# Patient Record
Sex: Female | Born: 1985 | Race: Black or African American | Hispanic: No | Marital: Single | Smoking: Current every day smoker
Health system: Southern US, Community
[De-identification: ages and names within clinical notes are randomized; demographics above are authoritative.]

---

## 2007-12-02 ENCOUNTER — Emergency Department (HOSPITAL_COMMUNITY): Admission: EM | Admit: 2007-12-02 | Discharge: 2007-12-02 | Payer: Self-pay | Admitting: Family Medicine

## 2007-12-19 ENCOUNTER — Emergency Department (HOSPITAL_COMMUNITY): Admission: EM | Admit: 2007-12-19 | Discharge: 2007-12-19 | Payer: Self-pay | Admitting: Emergency Medicine

## 2009-03-16 ENCOUNTER — Emergency Department (HOSPITAL_COMMUNITY): Admission: EM | Admit: 2009-03-16 | Discharge: 2009-03-16 | Payer: Self-pay | Admitting: Family Medicine

## 2011-11-04 ENCOUNTER — Ambulatory Visit (INDEPENDENT_AMBULATORY_CARE_PROVIDER_SITE_OTHER): Payer: BC Managed Care – PPO | Admitting: Family Medicine

## 2011-11-04 VITALS — BP 124/76 | HR 93 | Temp 98.8°F | Resp 16 | Ht 66.5 in | Wt 198.4 lb

## 2011-11-04 DIAGNOSIS — L509 Urticaria, unspecified: Secondary | ICD-10-CM

## 2011-11-04 MED ORDER — PREDNISONE 20 MG PO TABS: ORAL_TABLET | ORAL | Status: AC

## 2011-11-04 NOTE — Progress Notes (Signed)
[facility] [address], [place] [address] [phone]  Date:  11/04/2011   Name:  Leah Daniels   DOB:  1985-07-30   MRN:  [id number]  PCP:  No primary provider on file.    Chief Complaint: Rash   History of Present Illness:  Leah Daniels is a 26 y.o. very pleasant female patient who presents with the following:  She has noted a rash on her arms, trunk, and sometimes on her thighs for about a month.  Allegra does seem to help. She has some itching, but not severely pruritic. The rash seems to start with hives, and then forms flat macules.   She is generally healthy.  She has not noted any signs or symptoms of illness.   She has not tried any creams or topicals.  She just takes her allegra in the morning and this does seem to help.   The only new exposure she can think of is that she had artificial braids placed, but this was done about 10 weeks ago.   LMP last week.   There is no problem list on file for this patient.   No past medical history on file.  No past surgical history on file.  History  Substance Use Topics  . Smoking status: Not on file  . Smokeless tobacco: Not on file  . Alcohol Use: Not on file    No family history on file.  No Known Allergies  Medication list has been reviewed and updated.  Current Outpatient Prescriptions on File Prior to Visit  Medication Sig Dispense Refill  . fexofenadine (ALLEGRA) 180 MG tablet Take 180 mg by mouth daily.        Review of Systems:  As per HPI- otherwise negative.   Physical Examination: Filed Vitals:   11/04/11 1259  BP: 124/76  Pulse: 93  Temp: 98.8 F (37.1 C)  Resp: 16   Filed Vitals:   11/04/11 1259  Height: 5' 6.5" (1.689 m)  Weight: 198 lb 6.4 oz (89.994 kg)   Body mass index is 31.54 kg/(m^2). Ideal Body Weight: Weight in (lb) to have BMI = 25: 156.9   GEN: WDWN, NAD, Non-toxic, A & O x 3, overweight HEENT: Atraumatic, Normocephalic. Neck supple. No masses, No  LAD.  TM and oropharynx wnl, PEERL, EOMI Ears and Nose: No external deformity. CV: RRR, No M/G/R. No JVD. No thrill. No extra heart sounds. PULM: CTA B, no wheezes, crackles, rhonchi. No retractions. No resp. distress. No accessory muscle use. EXTR: No c/c/e NEURO Normal gait.  PSYCH: Normally interactive. Conversant. Not depressed or anxious appearing.  Calm demeanor.  Skin: she has a blanching rash, a few urticaria and macules scattered over her arms (hands and wrists spared), upper back and chest/ breasts.  No other rash noted   Assessment and Plan: 1. Urticaria  predniSONE (DELTASONE) 20 MG tablet   Urticarial type rash- will try a short course of prednisone.  Let us know if not better in the next few days- Sooner if worse.   May continue Phyllis Ginger, MD

## 2012-10-31 ENCOUNTER — Ambulatory Visit (INDEPENDENT_AMBULATORY_CARE_PROVIDER_SITE_OTHER): Payer: BC Managed Care – PPO | Admitting: Physician Assistant

## 2012-10-31 VITALS — BP 124/76 | HR 100 | Temp 98.0°F | Resp 17 | Ht 67.0 in | Wt 198.0 lb

## 2012-10-31 DIAGNOSIS — N760 Acute vaginitis: Secondary | ICD-10-CM

## 2012-10-31 DIAGNOSIS — N898 Other specified noninflammatory disorders of vagina: Secondary | ICD-10-CM

## 2012-10-31 LAB — POCT WET PREP WITH KOH
KOH Prep POC: NEGATIVE
Trichomonas, UA: NEGATIVE
Yeast Wet Prep HPF POC: NEGATIVE

## 2012-10-31 MED ORDER — METRONIDAZOLE 500 MG PO TABS: 500.0000 mg | ORAL_TABLET | Freq: Two times a day (BID) | ORAL | Status: DC

## 2012-10-31 NOTE — Progress Notes (Signed)
  Subjective:    Patient ID: Leah Daniels, female    DOB: [birth year], 27 y.o.   MRN: [id number]  HPI   Leah Daniels is a very pleasant 27 yr old female here with concern for vaginal discharge.  States this has been going for a few weeks, maybe a month.  Describes a milky white discharge.  Has never had anything like this before.  Denies irritation or itching.  Denies urinary symptoms.  Not currently sexually active.  Last sexual encounter was 3 months ago with a female partner.  No concern for STI but would like testing today.  Thinks last testing was a number of years ago.  LMP 10/16/12, regular every month.     Review of Systems  Constitutional: Negative.   HENT: Negative.   Respiratory: Negative.   Cardiovascular: Negative.   Gastrointestinal: Negative.   Genitourinary: Positive for vaginal discharge. Negative for dysuria, frequency, hematuria, flank pain, vaginal bleeding and menstrual problem.  Musculoskeletal: Negative.   Skin: Negative.   Neurological: Negative.        Objective:   Physical Exam  Vitals reviewed. Constitutional: She is oriented to person, place, and time. She appears well-developed and well-nourished. No distress.  HENT:  Head: Normocephalic and atraumatic.  Eyes: Conjunctivae are normal. No scleral icterus.  Cardiovascular: Normal rate, regular rhythm and normal heart sounds.   Pulmonary/Chest: Effort normal and breath sounds normal.  Abdominal: Soft. Bowel sounds are normal. There is no tenderness.  Genitourinary: Uterus normal. There is no rash, tenderness or lesion on the right labia. There is no rash, tenderness or lesion on the left labia. Cervix exhibits no motion tenderness, no discharge and no friability. Right adnexum displays no mass, no tenderness and no fullness. Left adnexum displays no mass, no tenderness and no fullness. Vaginal discharge (homogenous, thin, white discharge) found.  Neurological: She is alert and oriented to person, place, and time.   Skin: Skin is warm and dry.  Psychiatric: She has a normal mood and affect. Her behavior is normal.    Results for orders placed in visit on 10/31/12  POCT WET PREP WITH KOH      Result Value Range   Trichomonas, UA Negative     Clue Cells Wet Prep HPF POC 2-3     Epithelial Wet Prep HPF POC 3-8     Yeast Wet Prep HPF POC neg     Bacteria Wet Prep HPF POC 4+     RBC Wet Prep HPF POC 1     WBC Wet Prep HPF POC 0-2     KOH Prep POC Negative           Assessment & Plan:  Bacterial vaginosis - Plan: metroNIDAZOLE (FLAGYL) 500 MG tablet  Vaginal discharge - Plan: POCT Wet Prep with KOH, GC/Chlamydia Probe Amp, CANCELED: POCT UA - Microscopic Only, CANCELED: POCT urinalysis dipstick   Leah Daniels is a pleasant 27 yr old female with bacterial vaginosis.  Will treat with Flagyl PO x 7 days.  Discussed RTC precautions.  Will follow up on genprobe and treat if necessary

## 2012-10-31 NOTE — Patient Instructions (Addendum)
Begin taking the metronidazole as directed.  DO NOT DRINK ALCOHOL WHILE TAKING THIS MEDICATION or for 48 hours after your last dose.  Please let me know if any symptoms are worsening or not improving.  I will let you know when the rest of your labs are back   Bacterial Vaginosis Bacterial vaginosis (BV) is a vaginal infection where the normal balance of bacteria in the vagina is disrupted. The normal balance is then replaced by an overgrowth of certain bacteria. There are several different kinds of bacteria that can cause BV. BV is the most common vaginal infection in women of childbearing age. CAUSES   The cause of BV is not fully understood. BV develops when there is an increase or imbalance of harmful bacteria.  Some activities or behaviors can upset the normal balance of bacteria in the vagina and put women at increased risk including:  Having a new sex partner or multiple sex partners.  Douching.  Using an intrauterine device (IUD) for contraception.  It is not clear what role sexual activity plays in the development of BV. However, women that have never had sexual intercourse are rarely infected with BV. Women do not get BV from toilet seats, bedding, swimming pools or from touching objects around them.  SYMPTOMS   Grey vaginal discharge.  A fish-like odor with discharge, especially after sexual intercourse.  Itching or burning of the vagina and vulva.  Burning or pain with urination.  Some women have no signs or symptoms at all. DIAGNOSIS  Your caregiver must examine the vagina for signs of BV. Your caregiver will perform lab tests and look at the sample of vaginal fluid through a microscope. They will look for bacteria and abnormal cells (clue cells), a pH test higher than 4.5, and a positive amine test all associated with BV.  RISKS AND COMPLICATIONS   Pelvic inflammatory disease (PID).  Infections following gynecology surgery.  Developing HIV.  Developing herpes  virus. TREATMENT  Sometimes BV will clear up without treatment. However, all women with symptoms of BV should be treated to avoid complications, especially if gynecology surgery is planned. Female partners generally do not need to be treated. However, BV may spread between female sex partners so treatment is helpful in preventing a recurrence of BV.   BV may be treated with antibiotics. The antibiotics come in either pill or vaginal cream forms. Either can be used with nonpregnant or pregnant women, but the recommended dosages differ. These antibiotics are not harmful to the baby.  BV can recur after treatment. If this happens, a second round of antibiotics will often be prescribed.  Treatment is important for pregnant women. If not treated, BV can cause a premature delivery, especially for a pregnant woman who had a premature birth in the past. All pregnant women who have symptoms of BV should be checked and treated.  For chronic reoccurrence of BV, treatment with a type of prescribed gel vaginally twice a week is helpful. HOME CARE INSTRUCTIONS   Finish all medication as directed by your caregiver.  Do not have sex until treatment is completed.  Tell your sexual partner that you have a vaginal infection. They should see their caregiver and be treated if they have problems, such as a mild rash or itching.  Practice safe sex. Use condoms. Only have 1 sex partner. PREVENTION  Basic prevention steps can help reduce the risk of upsetting the natural balance of bacteria in the vagina and developing BV:  Do not have  sexual intercourse (be abstinent).  Do not douche.  Use all of the medicine prescribed for treatment of BV, even if the signs and symptoms go away.  Tell your sex partner if you have BV. That way, they can be treated, if needed, to prevent reoccurrence. SEEK MEDICAL CARE IF:   Your symptoms are not improving after 3 days of treatment.  You have increased discharge, pain, or  fever. MAKE SURE YOU:   Understand these instructions.  Will watch your condition.  Will get help right away if you are not doing well or get worse. FOR MORE INFORMATION  Division of STD Prevention (DSTDP), Centers for Disease Control and Prevention: [employer] American Social Health Association (ASHA): www.ashastd.org  Document Released: 03/04/2005 Document Revised: 05/27/2011 Document Reviewed: 08/25/2008 [facility] Patient Information 2014 [place], [place].

## 2017-11-05 ENCOUNTER — Other Ambulatory Visit: Payer: Self-pay

## 2017-11-05 ENCOUNTER — Encounter (HOSPITAL_BASED_OUTPATIENT_CLINIC_OR_DEPARTMENT_OTHER): Payer: Self-pay | Admitting: Emergency Medicine

## 2017-11-05 ENCOUNTER — Emergency Department (HOSPITAL_BASED_OUTPATIENT_CLINIC_OR_DEPARTMENT_OTHER)
Admission: EM | Admit: 2017-11-05 | Discharge: 2017-11-05 | Disposition: A | Discharge status: Home / Self Care | Payer: BLUE CROSS/BLUE SHIELD | Attending: Emergency Medicine | Admitting: Emergency Medicine

## 2017-11-05 DIAGNOSIS — Y92009 Unspecified place in unspecified non-institutional (private) residence as the place of occurrence of the external cause: Secondary | ICD-10-CM | POA: Diagnosis not present

## 2017-11-05 DIAGNOSIS — Y999 Unspecified external cause status: Secondary | ICD-10-CM | POA: Diagnosis not present

## 2017-11-05 DIAGNOSIS — F17210 Nicotine dependence, cigarettes, uncomplicated: Secondary | ICD-10-CM | POA: Diagnosis not present

## 2017-11-05 DIAGNOSIS — S01111A Laceration without foreign body of right eyelid and periocular area, initial encounter: Secondary | ICD-10-CM | POA: Diagnosis not present

## 2017-11-05 DIAGNOSIS — Y939 Activity, unspecified: Secondary | ICD-10-CM | POA: Insufficient documentation

## 2017-11-05 DIAGNOSIS — W2209XA Striking against other stationary object, initial encounter: Secondary | ICD-10-CM | POA: Diagnosis not present

## 2017-11-05 DIAGNOSIS — S0990XA Unspecified injury of head, initial encounter: Secondary | ICD-10-CM | POA: Diagnosis present

## 2017-11-05 MED ORDER — LIDOCAINE-EPINEPHRINE (PF) 2 %-1:200000 IJ SOLN
INTRAMUSCULAR | Status: AC
  Filled 2017-11-05: qty 10

## 2017-11-05 MED ORDER — LIDOCAINE-EPINEPHRINE (PF) 2 %-1:200000 IJ SOLN
20.0000 mL | Freq: Once | INTRAMUSCULAR | Status: AC
  Administered 2017-11-05: 20 mL
  Filled 2017-11-05: qty 20

## 2017-11-05 NOTE — ED Provider Notes (Signed)
[facility] EMERGENCY DEPARTMENT Provider Note   CSN: [id number] Arrival date & time: 11/05/17  [id number]     History   Chief Complaint Chief Complaint  Patient presents with  . Head Injury    HPI Leah Daniels is a 32 y.o. female.  HPI 32 year old female presents the emergency department with a laceration to her right eyebrow region.  Reports positive loss consciousness after striking a neck pain.  Weakness of her arms or legs.  No active bleeding at this time.  Presents with a laceration of her right eyebrow region.  No other complaints.  No use of anticoagulants.  Symptoms are mild in severity.   History reviewed. No pertinent past medical history.  There are no active problems to display for this patient.   History reviewed. No pertinent surgical history.   OB History   None      Home Medications    Prior to Admission medications   Medication Sig Start Date End Date Taking? Authorizing Provider  metroNIDAZOLE (FLAGYL) 500 MG tablet Take 1 tablet (500 mg total) by mouth 2 (two) times daily with a meal. DO NOT CONSUME ALCOHOL WHILE TAKING THIS MEDICATION. 10/31/12   Godfrey PickEgan, Eleanore E, PA-C    Family History No family history on file.  Social History Social History   Tobacco Use  . Smoking status: Current Every Day Smoker    Packs/day: 0.50    Years: 8.00    Pack years: 4.00    Types: Cigarettes  . Smokeless tobacco: Never Used  Substance Use Topics  . Alcohol use: No  . Drug use: No     Allergies   Patient has no known allergies.   Review of Systems Review of Systems  All other systems reviewed and are negative.    Physical Exam Updated Vital Signs BP (!) 160/104 (BP Location: Right Arm)   Pulse (!) 108   Temp 99.1 F (37.3 C) (Oral)   Resp 18   LMP 10/22/2017   SpO2 100%   Physical Exam  Constitutional: She is oriented to person, place, and time. She appears well-developed and well-nourished.  HENT:  Head: Normocephalic.    Vertical laceration to right eyebrow without active bleeding.  No surrounding hematoma or significant tenderness.  No periorbital swelling or ecchymosis.  Extraocular movements are normal.  Eyes: EOM are normal.  Neck: Normal range of motion.  Pulmonary/Chest: Effort normal.  Abdominal: She exhibits no distension.  Musculoskeletal: Normal range of motion.  Neurological: She is alert and oriented to person, place, and time.  Psychiatric: She has a normal mood and affect.  Nursing note and vitals reviewed.    ED Treatments / Results  Labs (all labs ordered are listed, but only abnormal results are displayed) Labs Reviewed - No data to display  EKG None  Radiology No results found.  Procedures .Marland Kitchen.Laceration Repair Performed by: Azalia Bilisampos, Libby Goehring, MD Authorized by: Azalia Bilisampos, Larene Ascencio, MD    LACERATION REPAIR Performed by: Azalia BilisKevin Berthe Oley Consent: Verbal consent obtained. Risks and benefits: risks, benefits and alternatives were discussed Patient identity confirmed: provided demographic data Time out performed prior to procedure Prepped and Draped in normal sterile fashion Wound explored Laceration Location: Right eyebrow Laceration Length: 1.5 cm No Foreign Bodies seen or palpated Anesthesia: local infiltration Local anesthetic: lidocaine 2 % with epinephrine Anesthetic total: 3 ml Irrigation method: syringe Amount of cleaning: standard Skin closure: Layered closure (5-0 Vicryl, 5-0 Prolene) Number of sutures or staples: 1 deep simple interrupted, 4 running interlocked  Technique: 4 running interlocked Patient tolerance: Patient tolerated the procedure well with no immediate complications.   Medications Ordered in ED Medications  lidocaine-EPINEPHrine (XYLOCAINE W/EPI) 2 %-1:200000 (PF) injection 20 mL (20 mLs Infiltration Given 11/05/17 0745)     Initial Impression / Assessment and Plan / ED Course  I have reviewed the triage vital signs and the nursing notes.  Pertinent  labs & imaging results that were available during my care of the patient were reviewed by me and considered in my medical decision making (see chart for details).     Layered closure  Laceration repaired.  Minor closed head injury.  Infection warnings given.  Suture removal in 5 days.  Patient tolerated the procedure well.  Patient understands return to the emergency department for new or worsening symptoms.  Infection warnings given  Final Clinical Impressions(s) / ED Diagnoses   Final diagnoses:  Laceration of right eyebrow, initial encounter    ED Discharge Orders    None       Azalia Bilisampos, Ruchi Stoney, MD 11/05/17 [phone]

## 2017-11-05 NOTE — Discharge Instructions (Addendum)
Return to the ER in 5 days for suture removal  Sooner as needed  Apply antibacterial ointment twice a day to the wound

## 2017-11-05 NOTE — ED Triage Notes (Signed)
Pt states she hit her head on an a/c unit in her room last night. States + LOC. Has lac noted to R eyebrow

## 2017-11-14 ENCOUNTER — Encounter (HOSPITAL_BASED_OUTPATIENT_CLINIC_OR_DEPARTMENT_OTHER): Payer: Self-pay

## 2017-11-14 ENCOUNTER — Other Ambulatory Visit: Payer: Self-pay

## 2017-11-14 ENCOUNTER — Emergency Department (HOSPITAL_BASED_OUTPATIENT_CLINIC_OR_DEPARTMENT_OTHER)
Admission: EM | Admit: 2017-11-14 | Discharge: 2017-11-14 | Disposition: A | Discharge status: Home / Self Care | Payer: BLUE CROSS/BLUE SHIELD | Attending: Emergency Medicine | Admitting: Emergency Medicine

## 2017-11-14 DIAGNOSIS — S01111D Laceration without foreign body of right eyelid and periocular area, subsequent encounter: Secondary | ICD-10-CM | POA: Diagnosis present

## 2017-11-14 DIAGNOSIS — X58XXXA Exposure to other specified factors, initial encounter: Secondary | ICD-10-CM | POA: Diagnosis not present

## 2017-11-14 DIAGNOSIS — Z4802 Encounter for removal of sutures: Secondary | ICD-10-CM

## 2017-11-14 DIAGNOSIS — F17210 Nicotine dependence, cigarettes, uncomplicated: Secondary | ICD-10-CM | POA: Insufficient documentation

## 2017-11-14 NOTE — ED Provider Notes (Signed)
[facility] EMERGENCY DEPARTMENT Provider Note   CSN: [id number] Arrival date & time: 11/14/17  1537     History   Chief Complaint Chief Complaint  Patient presents with  . Suture / Staple Removal    HPI Leah Daniels is a 32 y.o. female with a history of tobacco abuse returns to the emergency department for suture removal today.  Patient was seen in the emergency department 08/21 for a laceration to the right eyebrow region after hitting her eyebrow on a dresser.  Per chart review patient had laceration repaired with layered closure, one deep simple interrupted suture, 4 running interlocked stitches.  She has no complaints at this time.  She has been applying Neosporin.  She states the wound has been healing well.  No specific alleviating or aggravating factors.  Denies redness, wound drainage, fever, or chills.  HPI  History reviewed. No pertinent past medical history.  There are no active problems to display for this patient.   History reviewed. No pertinent surgical history.   OB History   None      Home Medications    Prior to Admission medications   Medication Sig Start Date End Date Taking? Authorizing Provider  metroNIDAZOLE (FLAGYL) 500 MG tablet Take 1 tablet (500 mg total) by mouth 2 (two) times daily with a meal. DO NOT CONSUME ALCOHOL WHILE TAKING THIS MEDICATION. 10/31/12   Godfrey Pick, PA-C    Family History No family history on file.  Social History Social History   Tobacco Use  . Smoking status: Current Every Day Smoker    Packs/day: 0.50    Years: 8.00    Pack years: 4.00    Types: Cigarettes  . Smokeless tobacco: Never Used  Substance Use Topics  . Alcohol use: No  . Drug use: No     Allergies   Patient has no known allergies.   Review of Systems Review of Systems  Constitutional: Negative for chills and fever.  Skin: Positive for wound. Negative for color change.     Physical Exam Updated Vital Signs BP (!)  172/126 (BP Location: Left Arm)   Pulse 100   Temp 99.8 F (37.7 C) (Oral)   Resp 18   Ht 5\' 6"  (1.676 m)   Wt 88.5 kg   LMP 10/22/2017   SpO2 100%   BMI 31.47 kg/m   Physical Exam  Constitutional: She appears well-developed and well-nourished. No distress.  HENT:  Head: Normocephalic and atraumatic.  Eyes: Conjunctivae are normal. Right eye exhibits no discharge. Left eye exhibits no discharge.  Neurological: She is alert.  Clear speech.   Skin:  There is a 1.5 cm well-healed laceration to the medial aspect of the right eyebrow.  No surrounding erythema.  No purulent drainage.  No increased warmth.  No palpable induration or fluctuance.  There appears to be for interlocked sutures in place.  Psychiatric: She has a normal mood and affect. Her behavior is normal. Thought content normal.  Nursing note and vitals reviewed.    ED Treatments / Results  Labs (all labs ordered are listed, but only abnormal results are displayed) Labs Reviewed - No data to display  EKG None  Radiology No results found.  Procedures .Suture Removal Date/Time: 11/14/2017 4:03 PM Performed by: Leah Anderson, PA-C Authorized by: Leah Anderson, PA-C   Consent:    Consent obtained:  Verbal   Consent given by:  Patient   Risks discussed:  Bleeding, pain and wound  separation   Alternatives discussed:  No treatment Location:    Location:  Head/neck   Head/neck location:  Eyebrow   Eyebrow location:  R eyebrow Procedure details:    Wound appearance:  No signs of infection   Sutures removed: 1 full suture (4 interlocked sutures) Post-procedure details:    Post-removal:  Antibiotic ointment applied   Patient tolerance of procedure:  Tolerated well, no immediate complications   (including critical care time)  Medications Ordered in ED Medications - No data to display   Initial Impression / Assessment and Plan / ED Course  I have reviewed the triage vital signs and the  nursing notes.  Pertinent labs & imaging results that were available during my care of the patient were reviewed by me and considered in my medical decision making (see chart for details).    Staple removal   Patient to ER for suture removal and wound check as above. Procedure tolerated well. No signs of infection. Scar minimization & return precautions given at discharge.   Additionally patient's blood pressure noted to be elevated, doubt HTN emergency, patient aware of need for recheck by PCP.   Final Clinical Impressions(s) / ED Diagnoses   Final diagnoses:  Visit for suture removal    ED Discharge Orders    None       Leah Daniels, Valdez Brannan R, PA-C 11/14/17 1607    Terrilee FilesButler, Michael C, MD 11/15/17 1007

## 2017-11-14 NOTE — ED Notes (Signed)
ED Provider at bedside. 

## 2017-11-14 NOTE — Discharge Instructions (Addendum)
You were seen in the emergency department today to have your stitches removed.  The stitches were removed.  The wound appears to be healing well.  Please keep this covered when out in the sun.  You may continue to apply Neosporin for the next few days.  As discussed with your previous visit return to the ER for surrounding redness, purulent drainage, fever, or any other concerns.  Additionally please have your blood pressure rechecked by primary care provider within 1 week as it was elevated in the emergency department today.

## 2017-11-14 NOTE — ED Triage Notes (Signed)
Sutures to right eyebrow removal, placed 8/21

## 2020-03-27 ENCOUNTER — Emergency Department (HOSPITAL_BASED_OUTPATIENT_CLINIC_OR_DEPARTMENT_OTHER)
Admission: EM | Admit: 2020-03-27 | Discharge: 2020-03-27 | Disposition: A | Discharge status: Home / Self Care | Payer: BC Managed Care – PPO | Attending: Emergency Medicine | Admitting: Emergency Medicine

## 2020-03-27 ENCOUNTER — Encounter (HOSPITAL_BASED_OUTPATIENT_CLINIC_OR_DEPARTMENT_OTHER): Payer: Self-pay | Admitting: Emergency Medicine

## 2020-03-27 ENCOUNTER — Other Ambulatory Visit: Payer: Self-pay

## 2020-03-27 ENCOUNTER — Emergency Department (HOSPITAL_BASED_OUTPATIENT_CLINIC_OR_DEPARTMENT_OTHER): Payer: BC Managed Care – PPO

## 2020-03-27 DIAGNOSIS — F17210 Nicotine dependence, cigarettes, uncomplicated: Secondary | ICD-10-CM | POA: Diagnosis not present

## 2020-03-27 DIAGNOSIS — R030 Elevated blood-pressure reading, without diagnosis of hypertension: Secondary | ICD-10-CM | POA: Insufficient documentation

## 2020-03-27 DIAGNOSIS — S6991XA Unspecified injury of right wrist, hand and finger(s), initial encounter: Secondary | ICD-10-CM | POA: Diagnosis present

## 2020-03-27 DIAGNOSIS — W182XXA Fall in (into) shower or empty bathtub, initial encounter: Secondary | ICD-10-CM | POA: Diagnosis not present

## 2020-03-27 DIAGNOSIS — S62614A Displaced fracture of proximal phalanx of right ring finger, initial encounter for closed fracture: Secondary | ICD-10-CM | POA: Diagnosis not present

## 2020-03-27 DIAGNOSIS — R009 Unspecified abnormalities of heart beat: Secondary | ICD-10-CM

## 2020-03-27 NOTE — Discharge Instructions (Addendum)
Wear the splint while at work and as needed for protection and pain control. Call the hand doctor listed below to set up a follow-up appointment. Return to the emergency room if you develop severe worsening pain, numbness, color change of your finger, any new, sudden, concerning symptoms.  It is important that you establish with a primary care doctor to help manage your blood pressure.  Call the number in the back of your insurance card to find out who is in network.  In the meantime, eat a low-salt diet and exercise to help with your blood pressure.

## 2020-03-27 NOTE — ED Triage Notes (Signed)
Pt fell in shower one month ago and injured right hand.  Right ring finger continues to be swollen and unable to bend.  Skin warm and dry.  Good cap refill.

## 2020-03-27 NOTE — ED Provider Notes (Signed)
[facility] EMERGENCY DEPARTMENT Provider Note   CSN: [id number] Arrival date & time: 03/27/20  1316     History Chief Complaint  Patient presents with  . Finger Injury    Leah Daniels is a 35 y.o. female presenting for evaluation of finger injury.  Patient states 4 weeks ago she fell in the shower and injured her right ring finger.  Since then, she has had persistent pain and swelling, difficulty bending her finger.  This is now hindering her at work, which prompted her ER visit today.  She denies any numbness or tingling.  She denies injury elsewhere.  She is right-handed.  She is not taking anything for pain.  Pain does not radiate.  She has no other medical problems, takes no medications daily.  HPI     No past medical history on file.  There are no problems to display for this patient.   No past surgical history on file.   OB History   No obstetric history on file.     No family history on file.  Social History   Tobacco Use  . Smoking status: Current Every Day Smoker    Packs/day: 0.50    Years: 8.00    Pack years: 4.00    Types: Cigarettes  . Smokeless tobacco: Never Used  Substance Use Topics  . Alcohol use: No  . Drug use: No    Home Medications Prior to Admission medications   Medication Sig Start Date End Date Taking? Authorizing Provider  metroNIDAZOLE (FLAGYL) 500 MG tablet Take 1 tablet (500 mg total) by mouth 2 (two) times daily with a meal. DO NOT CONSUME ALCOHOL WHILE TAKING THIS MEDICATION. 10/31/12   Godfrey Pick, PA-C    Allergies    Patient has no known allergies.  Review of Systems   Review of Systems  Musculoskeletal: Positive for arthralgias and joint swelling.  Neurological: Negative for numbness.    Physical Exam Updated Vital Signs BP (!) 213/122 (BP Location: Left Arm)   Pulse 88   Temp 98.6 F (37 C) (Oral)   Resp 20   Ht 5\' 6"  (1.676 m)   Wt 86.2 kg   LMP 03/27/2020   SpO2 100%   BMI 30.67 kg/m    Physical Exam Vitals and nursing note reviewed.  Constitutional:      General: She is not in acute distress.    Appearance: She is well-developed and well-nourished.  HENT:     Head: Normocephalic and atraumatic.  Eyes:     Extraocular Movements: EOM normal.  Pulmonary:     Effort: Pulmonary effort is normal.  Abdominal:     General: There is no distension.  Musculoskeletal:        General: Tenderness and deformity present.     Cervical back: Normal range of motion.     Comments: Swelling and tenderness of the right proximal phalanx of the ring finger.  Limited range of motion due to swelling and pain.  When held in isolation, patient able to flex and extend at the DIP without difficulty.  Good distal sensation and cap refill of the R ring finger.   Skin:    General: Skin is warm.     Capillary Refill: Capillary refill takes less than 2 seconds.     Findings: No rash.  Neurological:     Mental Status: She is alert and oriented to person, place, and time.  Psychiatric:        Mood and  Affect: Mood and affect normal.     ED Results / Procedures / Treatments   Labs (all labs ordered are listed, but only abnormal results are displayed) Labs Reviewed - No data to display  EKG None  Radiology DG Finger Ring Right  Result Date: 03/27/2020 CLINICAL DATA:  Right ring finger pain after falling in the shower 4 weeks previously EXAM: RIGHT RING FINGER 2+V COMPARISON:  None. FINDINGS: Obliquely oriented fracture through the proximal phalanx of the ring finger. The distal fracture fragment is displaced ulnar by approximately 4 mm and there is 6 mm of fracture override due to proximal displacement. The remaining visualized bones and joints are intact and unremarkable. Normal bony mineralization. IMPRESSION: Obliquely oriented fracture through the proximal phalanx of the ring finger with ulnar displacement and approximately 6 mm of fracture fragment override. Electronically Signed   By:  Malachy Moan M.D.   On: 03/27/2020 14:43    Procedures Procedures (including critical care time)  Medications Ordered in ED Medications - No data to display  ED Course  I have reviewed the triage vital signs and the nursing notes.  Pertinent labs & imaging results that were available during my care of the patient were reviewed by me and considered in my medical decision making (see chart for details).    MDM Rules/Calculators/A&P                          Patient presenting for evaluation of right ring finger injury.  On exam, patient is neurovascularly intact.  X-ray obtained from triage read interpreted by me, does show an oblique fracture which is minimally displaced.  As this happened 4 weeks ago, patient has continued nonunion of the fracture with displacement and decreased range of motion, she will likely need specialist evaluation with planned surgery.  Discussed with patient.  Will place in splint until she can follow-up.  At this time, patient present to discharge.  Return precautions given.  Patient states he understands and agrees to plan.  Additionally, patient was noted to be extremely hypertensive at 210/120.  Per chart review, she does have a history of hypertension at previous ED visits.  Discussed with patient, simply states she has been told that she has an elevated blood pressure, but never formally diagnosed with hypertension nor she on any medication.  She does not currently have a primary care doctor.  I stressed the importance of following up with PCP due to her significantly elevated blood pressure.  Final Clinical Impression(s) / ED Diagnoses Final diagnoses:  Closed displaced fracture of proximal phalanx of right ring finger, initial encounter  Elevated heart rate with elevated blood pressure without diagnosis of hypertension    Rx / DC Orders ED Discharge Orders    None       Alveria Apley, PA-C 03/27/20 1943    Cheryll Cockayne, MD 03/29/20  [phone]

## 2021-04-25 ENCOUNTER — Other Ambulatory Visit: Payer: Self-pay

## 2021-04-25 ENCOUNTER — Ambulatory Visit
Admission: EM | Admit: 2021-04-25 | Discharge: 2021-04-25 | Disposition: A | Discharge status: Home / Self Care | Payer: BC Managed Care – PPO | Attending: Emergency Medicine | Admitting: Emergency Medicine

## 2021-04-25 DIAGNOSIS — I10 Essential (primary) hypertension: Secondary | ICD-10-CM

## 2021-04-25 DIAGNOSIS — M5431 Sciatica, right side: Secondary | ICD-10-CM

## 2021-04-25 DIAGNOSIS — M5416 Radiculopathy, lumbar region: Secondary | ICD-10-CM

## 2021-04-25 DIAGNOSIS — M5432 Sciatica, left side: Secondary | ICD-10-CM

## 2021-04-25 MED ORDER — HYDROCHLOROTHIAZIDE 25 MG PO TABS: 25.0000 mg | ORAL_TABLET | Freq: Every day | ORAL | 1 refills | Status: AC

## 2021-04-25 MED ORDER — KETOROLAC TROMETHAMINE 60 MG/2ML IM SOLN
60.0000 mg | Freq: Once | INTRAMUSCULAR | Status: AC
  Administered 2021-04-25: 60 mg via INTRAMUSCULAR

## 2021-04-25 MED ORDER — BACLOFEN 10 MG PO TABS: 10.0000 mg | ORAL_TABLET | Freq: Every day | ORAL | 0 refills | Status: AC

## 2021-04-25 MED ORDER — IBUPROFEN 800 MG PO TABS: 800.0000 mg | ORAL_TABLET | Freq: Three times a day (TID) | ORAL | 0 refills | Status: AC | PRN

## 2021-04-25 NOTE — ED Triage Notes (Signed)
Pt c/o left leg pain that starts in lower left back and radiates down leg. She reports it might be her sciatic nerve.

## 2021-04-25 NOTE — Discharge Instructions (Addendum)
You were provided with an injection of a powerful nonsteroidal anti-inflammatory pain medication during your visit today called ketorolac.  Ketorolac is not sedating, not habit-forming and not narcotic.  Ketorolac will significantly reduce your inflammation and pain for the next 6 to 8 hours.    At bedtime tonight, I recommend that you begin baclofen, and antispasmodic muscle relaxer which should help you get comfortable for sleep tonight and also take a dose of ibuprofen 800 mg.    Tomorrow, please continue ibuprofen 800 mg 3 times daily.  Please continue to place ice to your right lower back 4 times daily for 20 minutes each application.    Please avoid stretching or attempting any strengthening exercises while you are still experiencing pain.  I have ordered an x-ray of your lumbar spine at our [facility] location.  The x-ray order is valid for 1 year.  You are welcome to have this x-ray done at any time you like.  You would not need to be admitted to the [facility] location as a patient because you have already been seen at urgent care.  Once we receive the results of your x-ray, we will contact you to let you know what they showed.  For your high blood pressure, I recommend that you begin taking hydrochlorothiazide which is a daily diuretic that will help eliminate excess fluid thereby lowering your diastolic blood pressure, the bottom number.  Once the bottom blood pressure number trends down, the top blood pressure number will trend down with it.  I have also included some information about hypertension, diet changes that can help improve your hypertension.  I have initiated request for you to be connected with a primary care provider.  Please expect a phone call in the next few days to help you set an appointment up.  This will be a good time for you to follow-up with a medical provider about your blood pressure and your lower back pain.  In the next few  days, either Friday or Monday, please feel free to return to urgent care here to receive a repeat injection of ketorolac if you feel this would be of benefit.  Thank you for visiting urgent care today.  I appreciate the opportunity to participate in your care.

## 2021-04-25 NOTE — ED Provider Notes (Signed)
[facility]    CSN: [id number] Arrival date & time: 04/25/21  1350    HISTORY   Chief Complaint  Patient presents with   Leg Pain   HPI Leah Daniels is a 36 y.o. female. Pt c/o left leg pain that starts in lower left back and radiates down leg. She reports it might be her sciatic nerve.  Patient states she has had similar lower back pain but its been several years.  EMR reviewed, patient was last seen by provider at [facility] health in 2017 for this issue, at the time she was not experiencing any radiculopathy.  Patient has elevated blood pressure on arrival with an elevated heart rate.  Patient states she currently does not have a primary care provider, has never been diagnosed or treated for hypertension in the past.  The history is provided by the patient.  History reviewed. No pertinent past medical history. There are no problems to display for this patient.  History reviewed. No pertinent surgical history. OB History   No obstetric history on file.    Home Medications    Prior to Admission medications   Not on File    Family History History reviewed. No pertinent family history. Social History Social History   Tobacco Use   Smoking status: Every Day    Packs/day: 0.50    Years: 8.00    Pack years: 4.00    Types: Cigarettes   Smokeless tobacco: Never  Substance Use Topics   Alcohol use: No   Drug use: No   Allergies   Patient has no known allergies.  Review of Systems Review of Systems Pertinent findings noted in history of present illness.   Physical Exam Triage Vital Signs ED Triage Vitals  Enc Vitals Group     BP 01/12/21 0827 (!) 147/82     Pulse Rate 01/12/21 0827 72     Resp 01/12/21 0827 18     Temp 01/12/21 0827 98.3 F (36.8 C)     Temp Source 01/12/21 0827 Oral     SpO2 01/12/21 0827 98 %     Weight --      Height --      Head Circumference --      Peak Flow --      Pain Score 01/12/21 0826 5     Pain Loc --      Pain Edu? --       Excl. in GC? --   No data found.  Updated Vital Signs BP (!) 166/125 (BP Location: Right Arm) Comment: pt states she is trying to find PCP for HTN, provider aware   Pulse (!) 121    Temp 98.7 F (37.1 C) (Oral)    Resp 18    LMP 03/21/2021 (Approximate)    SpO2 97%   Physical Exam  Visual Acuity Right Eye Distance:   Left Eye Distance:   Bilateral Distance:    Right Eye Near:   Left Eye Near:    Bilateral Near:     UC Couse / Diagnostics / Procedures:    EKG  Radiology No results found.  Procedures Procedures (including critical care time)  UC Diagnoses / Final Clinical Impressions(s)   I have reviewed the triage vital signs and the nursing notes.  Pertinent labs & imaging results that were available during my care of the patient were reviewed by me and considered in my medical decision making (see chart for details).    Final diagnoses:  Essential  hypertension  Left sciatic nerve pain  Lumbar back pain with radiculopathy affecting left lower extremity   For immediately relief of pain, patient was provided with a ketorolac injection in the office, this was tolerated well.  I have also advised pt to take Ibuprofen 800 mg 3 times daily, take Baclofen 10 mg daily at bedtime, soak in hot bath for 4 weightless relaxation, apply ice pack to affected area 4 times daily for 20 minutes each time, avoid stretching or strengthening exercises until pain is completely resolved, and return to urgent care in the next 2 to 3 days for repeat ketorolac injection if needed.   Suspect patient likely is in a good deal of pain which is causing both to be elevated however given elevated systolic, recommend patient begin hydrochlorothiazide while waiting referral to PCP, I have initiated a referral request.  Patient was provided with a note for work.  ED Prescriptions     Medication Sig Dispense Auth. Provider   baclofen (LIORESAL) 10 MG tablet Take 1 tablet (10 mg total) by mouth at  bedtime for 7 days. 7 tablet Theadora Rama Scales, PA-C   ibuprofen (ADVIL) 800 MG tablet Take 1 tablet (800 mg total) by mouth every 8 (eight) hours as needed for up to 14 days. 42 tablet Theadora Rama Scales, PA-C   hydrochlorothiazide (HYDRODIURIL) 25 MG tablet Take 1 tablet (25 mg total) by mouth daily. 90 tablet Theadora Rama Scales, [place]      PDMP not reviewed this encounter.  Pending results:  Labs Reviewed - No data to display  Medications Ordered in UC: Medications  ketorolac (TORADOL) injection 60 mg (has no administration in time range)    Disposition Upon Discharge:  Condition: stable for discharge home Home: take medications as prescribed; routine discharge instructions as discussed; follow up as advised.  Patient presented with an acute illness with associated systemic symptoms and significant discomfort requiring urgent management. In my opinion, this is a condition that a prudent lay person (someone who possesses an average knowledge of health and medicine) may potentially expect to result in complications if not addressed urgently such as respiratory distress, impairment of bodily function or dysfunction of bodily organs.   Routine symptom specific, illness specific and/or disease specific instructions were discussed with the patient and/or caregiver at length.   As such, the patient has been evaluated and assessed, work-up was performed and treatment was provided in alignment with urgent care protocols and evidence based medicine.  Patient/parent/caregiver has been advised that the patient may require follow up for further testing and treatment if the symptoms continue in spite of treatment, as clinically indicated and appropriate.  If the patient was tested for COVID-19, Influenza and/or RSV, then the patient/parent/guardian was advised to isolate at home pending the results of his/her diagnostic coronavirus test and potentially longer if theyre positive. I have  also advised pt that if his/her COVID-19 test returns positive, it's recommended to self-isolate for at least 10 days after symptoms first appeared AND until fever-free for 24 hours without fever reducer AND other symptoms have improved or resolved. Discussed self-isolation recommendations as well as instructions for household member/close contacts as per the [facility] and [place] DHHS, and also gave patient the COVID packet with this information.  Patient/parent/caregiver has been advised to return to the [facility] or PCP in 3-5 days if no better; to PCP or the Emergency Department if new signs and symptoms develop, or if the current signs or symptoms continue to change or worsen  for further workup, evaluation and treatment as clinically indicated and appropriate  The patient will follow up with their current PCP if and as advised. If the patient does not currently have a PCP we will assist them in obtaining one.   The patient may need specialty follow up if the symptoms continue, in spite of conservative treatment and management, for further workup, evaluation, consultation and treatment as clinically indicated and appropriate.   Patient/parent/caregiver verbalized understanding and agreement of plan as discussed.  All questions were addressed during visit.  Please see discharge instructions below for further details of plan.  Discharge Instructions:   Discharge Instructions      You were provided with an injection of a powerful nonsteroidal anti-inflammatory pain medication during your visit today called ketorolac.  Ketorolac is not sedating, not habit-forming and not narcotic.  Ketorolac will significantly reduce your inflammation and pain for the next 6 to 8 hours.    At bedtime tonight, I recommend that you begin baclofen, and antispasmodic muscle relaxer which should help you get comfortable for sleep tonight and also take a dose of ibuprofen 800 mg.    Tomorrow, please continue ibuprofen 800 mg 3 times  daily.  Please continue to place ice to your right lower back 4 times daily for 20 minutes each application.    Please avoid stretching or attempting any strengthening exercises while you are still experiencing pain.  I have ordered an x-ray of your lumbar spine at our [facility] [address] [employer] location.  The x-ray order is valid for 1 year.  You are welcome to have this x-ray done at any time you like.  You would not need to be admitted to the [facility] location as a patient because you have already been seen at urgent care.  Once we receive the results of your x-ray, we will contact you to let you know what they showed.  For your high blood pressure, I recommend that you begin taking hydrochlorothiazide which is a daily diuretic that will help eliminate excess fluid thereby lowering your diastolic blood pressure, the bottom number.  Once the bottom blood pressure number trends down, the top blood pressure number will trend down with it.  I have also included some information about hypertension, diet changes that can help improve your hypertension.  I have initiated request for you to be connected with a primary care provider.  Please expect a phone call in the next few days to help you set an appointment up.  This will be a good time for you to follow-up with a medical provider about your blood pressure and your lower back pain.  In the next few days, either Friday or Monday, please feel free to return to urgent care here to receive a repeat injection of ketorolac if you feel this would be of benefit.  Thank you for visiting urgent care today.  I appreciate the opportunity to participate in your care.      This office note has been dictated using Teaching laboratory technician.  Unfortunately, and despite my best efforts, this method of dictation can sometimes lead to occasional typographical or grammatical errors.  I apologize in advance if this occurs.      Theadora Rama Scales, PA-C 04/25/21 1451

## 2022-08-29 IMAGING — DX DG FINGER RING 2+V*R*
4 series · 4 of 4 positions shown · non-contrast
Comparison: None.

CLINICAL DATA: Right ring finger pain after falling in the shower 4
weeks previously

EXAM:
RIGHT RING FINGER 2+V

[finger ap]
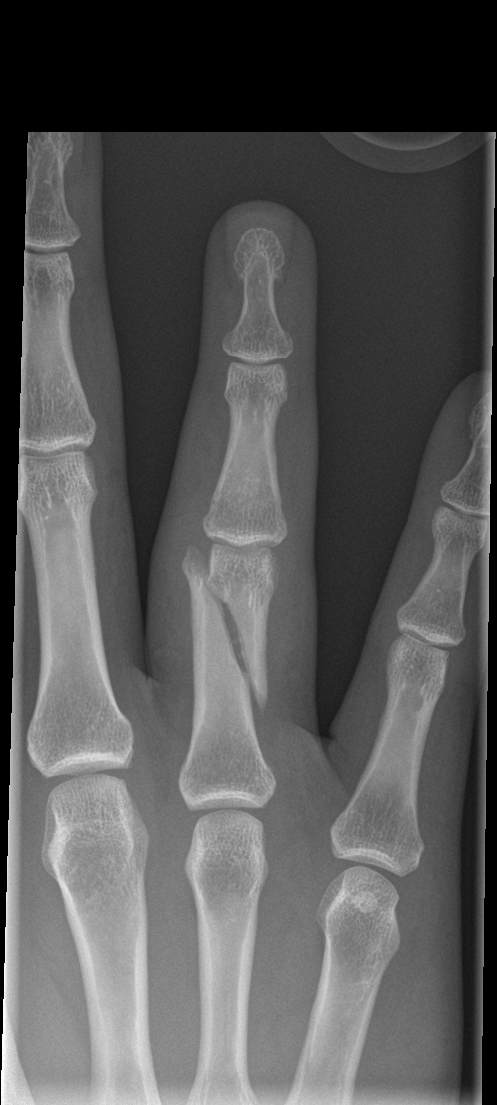

[finger obl]
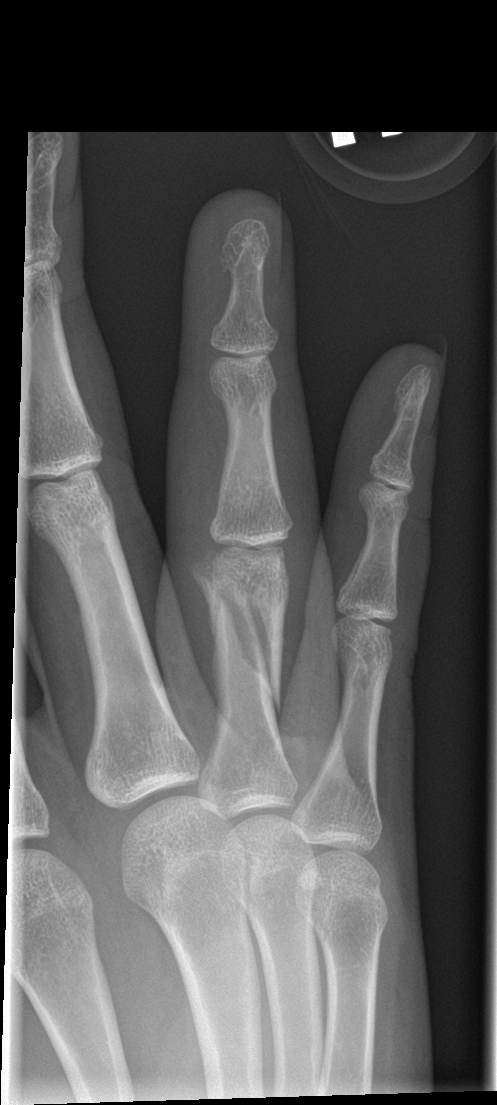

[finger lat (1 of 2)]
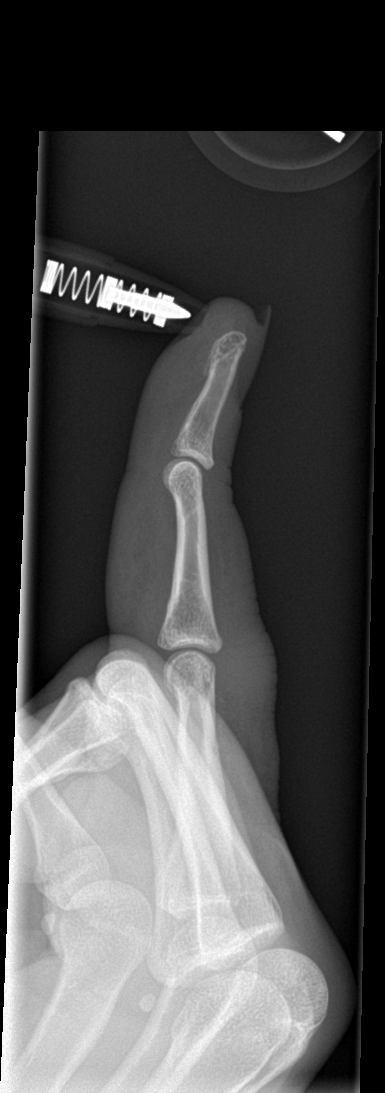

[finger lat (2 of 2)]
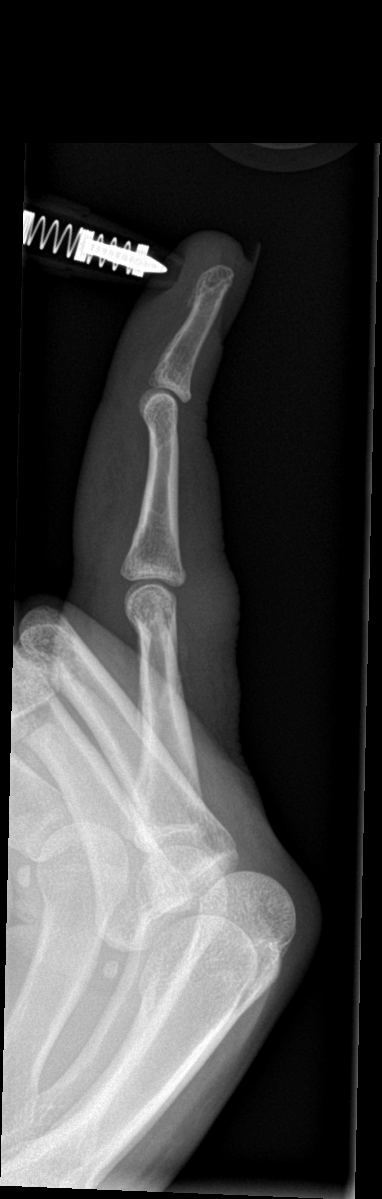

[4 of 4 positions shown; findings below may reference images not displayed]

FINDINGS: Obliquely oriented fracture through the proximal phalanx of the ring
finger. The distal fracture fragment is displaced ulnar by
approximately 4 mm and there is 6 mm of fracture override due to
proximal displacement. The remaining visualized bones and joints are
intact and unremarkable. Normal bony mineralization.
IMPRESSION: Obliquely oriented fracture through the proximal phalanx of the ring
finger with ulnar displacement and approximately 6 mm of fracture
fragment override.
